# Patient Record
Sex: Male | Born: 1995 | Race: White | Hispanic: No | Marital: Single | Smoking: Never smoker
Health system: Southern US, Community
[De-identification: ages and names within clinical notes are randomized; demographics above are authoritative.]

## PROBLEM LIST (undated history)

## (undated) ENCOUNTER — Emergency Department (HOSPITAL_COMMUNITY): Discharge status: Against Medical Advice | Payer: Medicaid Other

## (undated) DIAGNOSIS — R55 Syncope and collapse: Secondary | ICD-10-CM

## (undated) DIAGNOSIS — F909 Attention-deficit hyperactivity disorder, unspecified type: Secondary | ICD-10-CM

## (undated) DIAGNOSIS — G8929 Other chronic pain: Secondary | ICD-10-CM

## (undated) DIAGNOSIS — I4581 Long QT syndrome: Secondary | ICD-10-CM

## (undated) DIAGNOSIS — R29898 Other symptoms and signs involving the musculoskeletal system: Secondary | ICD-10-CM

## (undated) DIAGNOSIS — R079 Chest pain, unspecified: Secondary | ICD-10-CM

## (undated) HISTORY — DX: Syncope and collapse: R55

---
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
## 2017-04-30 ENCOUNTER — Encounter: Payer: Self-pay | Admitting: Family

## 2017-04-30 ENCOUNTER — Ambulatory Visit (INDEPENDENT_AMBULATORY_CARE_PROVIDER_SITE_OTHER): Payer: Medicaid Other | Admitting: Family

## 2017-04-30 VITALS — BP 114/77 | HR 63 | Temp 97.3°F | Ht 64.0 in | Wt 148.2 lb

## 2017-04-30 DIAGNOSIS — M7522 Bicipital tendinitis, left shoulder: Secondary | ICD-10-CM | POA: Diagnosis not present

## 2017-04-30 MED ORDER — NAPROXEN 500 MG PO TABS: 500.0000 mg | ORAL_TABLET | Freq: Two times a day (BID) | ORAL | 1 refills | Status: DC

## 2017-04-30 NOTE — Patient Instructions (Signed)
Biceps Tendon Tendinitis (Distal) Distal biceps tendon tendinitis is inflammation of the distal biceps tendon. The distal biceps tendon is a strong cord of tissue that connects the biceps muscle, on the front of the upper arm, to a bone (radius) in the elbow. Distal biceps tendon tendinitis can interfere with the ability to bend the elbow and turn the hand palm-up (supination). This condition is usually caused by overusing the elbow joint and the biceps muscle, and it usually heals within 6 weeks. Distal biceps tendon tendinitis may include a grade 1 or grade 2 strain of the tendon. A grade 1 strain is mild, and it involves a slight pull of the tendon without any stretching or noticeable tearing of the tendon. There is usually no loss of biceps muscle strength. A grade 2 strain is moderate, and it involves a small tear in the tendon. The tendon is stretched, and biceps muscle strength is usually decreased. What are the causes? This condition may be caused by:  A sudden increase in the frequency or intensity of activity that involves the elbow and the biceps muscle.  Overuse of the biceps muscle. This can happen when you do the same movements over and over, such as: ? Supination. ? Forceful straightening (hyperextension) of the elbow. ? Bending of the elbow.  A direct, forceful hit or injury (trauma) to the elbow. This is rare.  What increases the risk? The following factors may make you more likely to develop this condition:  Playing contact sports.  Playing sports that involve throwing and overhead movements, including racket sports, gymnastics, weight lifting, or bodybuilding.  Doing physical labor.  Having poor strength and flexibility of the arm and shoulder.  Having injured other parts of the elbow.  What are the signs or symptoms? Symptoms of this condition may include:  Pain and inflammation in the front of the elbow. Pain may get worse during certain movements, such  as: ? Supination. ? Bending the elbow. ? Lifting or carrying objects. ? Throwing.  A feeling of warmth in the front of the elbow.  A crackling sound (crepitation) when you move or touch the elbow or the upper arm.  In some cases, symptoms may return (recur) after treatment, and they may be long-lasting (chronic). How is this diagnosed? This condition is diagnosed based on your symptoms, your medical history, and a physical exam. You may have tests, including X-rays or MRIs. Your health care provider may test your range of motion by having you do arm movements. How is this treated? This condition is treated by resting and icing the injured area, and by doing physical therapy exercises. Depending on the severity of your condition, treatment may also include:  Medicines to help relieve pain and inflammation.  Ultrasound therapy. This is the application of sound waves to the injured area.  Follow these instructions at home: Managing pain, stiffness, and swelling  If directed, put ice on the injured area: ? Put ice in a plastic bag. ? Place a towel between your skin and the bag. ? Leave the ice on for 20 minutes, 2-3 times a day.  Move your fingers often to avoid stiffness and to lessen swelling.  Raise (elevate) the injured area above the level of your heart while you are sitting or lying down.  If directed, apply heat to the affected area before you exercise. Use the heat source that your health care provider recommends, such as a moist heat pack or a heating pad. ? Place a towel between   your skin and the heat source. ? Leave the heat on for 20-30 minutes. ? Remove the heat if your skin turns bright red. This is especially important if you are unable to feel pain, heat, or cold. You may have a greater risk of getting burned. Activity  Return to your normal activities as told by your health care provider. Ask your health care provider what activities are safe for you.  Do not lift  anything that is heavier than 10 lb (4.5 kg) until your health care provider tells you that it is safe.  Avoid activities that cause pain or make your condition worse.  Do exercises as told by your health care provider. General instructions  Take over-the-counter and prescription medicines only as told by your health care provider.  Do not drive or operate heavy machinery while taking prescription pain medicines.  Keep all follow-up visits as told by your health care provider. This is important. How is this prevented?  Warm up and stretch before being active.  Cool down and stretch after being active.  Give your body time to rest between periods of activity.  Make sure to use equipment that fits you.  Be safe and responsible while being active to avoid falls.  Do at least 150 minutes of moderate-intensity exercise each week, such as brisk walking or water aerobics.  Maintain physical fitness, including: ? Strength. ? Flexibility. ? Cardiovascular fitness. ? Endurance. Contact a health care provider if:  You have symptoms that get worse or do not get better after 2 weeks of treatment.  You develop new symptoms. Get help right away if:  You develop severe pain. This information is not intended to replace advice given to you by your health care provider. Make sure you discuss any questions you have with your health care provider. Document Released: 10/26/2005 Document Revised: 07/02/2016 Document Reviewed: 10/04/2015 Elsevier Interactive Patient Education  2018 [employer]  

## 2017-04-30 NOTE — Progress Notes (Signed)
Subjective:    Patient ID: Willie Foley [facility], male    DOB: [birth year], 21 y.o.   MRN: [id number]  Pt presents to the office today with left shoulder and bicep pain. He states he went to the gym for the first time three days ago.  Shoulder Pain   The pain is present in the left shoulder. This is a new problem. The current episode started yesterday. There has been no history of extremity trauma. The problem occurs intermittently. The problem has been waxing and waning. The quality of the pain is described as aching. The pain is at a severity of 7/10. The pain is mild. Pertinent negatives include no inability to bear weight, limited range of motion, numbness, stiffness or tingling. The symptoms are aggravated by activity. He has tried nothing for the symptoms. The treatment provided no relief.      Review of Systems  Musculoskeletal: Positive for myalgias. Negative for stiffness.  Neurological: Negative for tingling and numbness.  All other systems reviewed and are negative.      Objective:   Physical Exam  Constitutional: He is oriented to person, place, and time. He appears well-developed and well-nourished. No distress.  HENT:  Head: Normocephalic.  Eyes: Pupils are equal, round, and reactive to light. Right eye exhibits no discharge. Left eye exhibits no discharge.  Cardiovascular: Normal rate, regular rhythm, normal heart sounds and intact distal pulses.   No murmur heard. Pulmonary/Chest: Effort normal and breath sounds normal. No respiratory distress. He has no wheezes.  Abdominal: Soft. Bowel sounds are normal. He exhibits no distension. There is no tenderness.  Musculoskeletal: Normal range of motion. He exhibits tenderness. He exhibits no edema.  Right bicep pain with abduction   Neurological: He is alert and oriented to person, place, and time.  Skin: Skin is warm and dry. No rash noted. No erythema.  Psychiatric: He has a normal mood and affect. His behavior is normal.  Judgment and thought content normal.  Vitals reviewed.     BP 114/77   Pulse 63   Temp 97.3 F (36.3 C) (Oral)   Ht 5\' 4"  (1.626 m)   Wt 148 lb 3.2 oz (67.2 kg)   BMI 25.44 kg/m      Assessment & Plan:  1. Biceps tendinitis of left upper extremity Rest Ice  Avoid weight lifting for next 1-2 weeks RTO prn  - naproxen (NAPROSYN) 500 MG tablet; Take 1 tablet (500 mg total) by mouth 2 (two) times daily with a meal.  Dispense: 60 tablet; Refill: 1   Jannifer Rodney, FNP

## 2017-12-11 DIAGNOSIS — R112 Nausea with vomiting, unspecified: Secondary | ICD-10-CM | POA: Diagnosis not present

## 2018-08-11 DIAGNOSIS — J028 Acute pharyngitis due to other specified organisms: Secondary | ICD-10-CM | POA: Diagnosis not present

## 2018-08-11 DIAGNOSIS — R509 Fever, unspecified: Secondary | ICD-10-CM | POA: Diagnosis not present

## 2018-08-11 DIAGNOSIS — J029 Acute pharyngitis, unspecified: Secondary | ICD-10-CM | POA: Diagnosis not present

## 2018-08-12 DIAGNOSIS — Z8709 Personal history of other diseases of the respiratory system: Secondary | ICD-10-CM | POA: Diagnosis not present

## 2018-08-12 DIAGNOSIS — J029 Acute pharyngitis, unspecified: Secondary | ICD-10-CM | POA: Diagnosis not present

## 2019-02-17 DIAGNOSIS — K0889 Other specified disorders of teeth and supporting structures: Secondary | ICD-10-CM | POA: Diagnosis not present

## 2019-02-20 DIAGNOSIS — Z79899 Other long term (current) drug therapy: Secondary | ICD-10-CM | POA: Diagnosis not present

## 2019-02-20 DIAGNOSIS — K0889 Other specified disorders of teeth and supporting structures: Secondary | ICD-10-CM | POA: Diagnosis not present

## 2019-09-05 ENCOUNTER — Other Ambulatory Visit: Payer: Self-pay

## 2019-09-05 DIAGNOSIS — Z20822 Contact with and (suspected) exposure to covid-19: Secondary | ICD-10-CM

## 2019-09-05 DIAGNOSIS — Z20828 Contact with and (suspected) exposure to other viral communicable diseases: Secondary | ICD-10-CM | POA: Diagnosis not present

## 2019-09-07 LAB — NOVEL CORONAVIRUS, NAA: SARS-CoV-2, NAA: NOT DETECTED

## 2019-10-16 ENCOUNTER — Other Ambulatory Visit: Payer: Self-pay

## 2019-10-16 ENCOUNTER — Encounter: Payer: Self-pay | Admitting: Family Medicine

## 2019-10-16 ENCOUNTER — Ambulatory Visit (INDEPENDENT_AMBULATORY_CARE_PROVIDER_SITE_OTHER): Payer: Medicaid Other | Admitting: Family Medicine

## 2019-10-16 DIAGNOSIS — J069 Acute upper respiratory infection, unspecified: Secondary | ICD-10-CM | POA: Diagnosis not present

## 2019-10-16 NOTE — Progress Notes (Signed)
   Virtual Visit via telephone Note  I connected with Willie Foley on 10/16/19 at 1341 by telephone and verified that I am speaking with the correct person using two identifiers. Willie Foley is currently located at home and mother are currently with her during visit. The provider, Fransisca Kaufmann Krystina Strieter, MD is located in their office at time of visit.  Call ended at 1349  I discussed the limitations, risks, security and privacy concerns of performing an evaluation and management service by telephone and the availability of in person appointments. I also discussed with the patient that there may be a patient responsible charge related to this service. The patient expressed understanding and agreed to proceed.   History and Present Illness: Patient is calling in for cough and sore throat that has been going on for 4 days.  He is getting headaches as well and he is coughing phlegm and having body aches.  The body aches started yesterday and denies any fevers.  He denies any sick contacts that he knows of. He works at a [employer].   No diagnosis found.  Outpatient Encounter Medications as of 10/16/2019  Medication Sig  . naproxen (NAPROSYN) 500 MG tablet Take 1 tablet (500 mg total) by mouth 2 (two) times daily with a meal.   No facility-administered encounter medications on file as of 10/16/2019.     Review of Systems  Constitutional: Positive for chills. Negative for fever.  HENT: Positive for congestion, postnasal drip, rhinorrhea, sinus pressure, sneezing and sore throat. Negative for ear discharge, ear pain and voice change.   Eyes: Negative for pain, discharge, redness and visual disturbance.  Respiratory: Positive for cough. Negative for shortness of breath and wheezing.   Cardiovascular: Negative for chest pain and leg swelling.  Musculoskeletal: Positive for myalgias. Negative for gait problem.  Skin: Negative for rash.  Neurological: Positive for headaches.  All other systems  reviewed and are negative.   Observations/Objective: Patient sounds comfortable  Assessment and Plan: Problem List Items Addressed This Visit    None    Visit Diagnoses    Viral upper respiratory infection    -  Primary   Relevant Orders   Novel Coronavirus, NAA ([facility])       Follow Up Instructions:  Recommended mucinex and flonase and recommend covid testing. Recommended quarantine.    I discussed the assessment and treatment plan with the patient. The patient was provided an opportunity to ask questions and all were answered. The patient agreed with the plan and demonstrated an understanding of the instructions.   The patient was advised to call back or seek an in-person evaluation if the symptoms worsen or if the condition fails to improve as anticipated.  The above assessment and management plan was discussed with the patient. The patient verbalized understanding of and has agreed to the management plan. Patient is aware to call the clinic if symptoms persist or worsen. Patient is aware when to return to the clinic for a follow-up visit. Patient educated on when it is appropriate to go to the emergency department.    I provided 8 minutes of non-face-to-face time during this encounter.    Worthy Rancher, MD

## 2019-10-17 ENCOUNTER — Other Ambulatory Visit: Payer: Self-pay

## 2019-10-17 DIAGNOSIS — Z20822 Contact with and (suspected) exposure to covid-19: Secondary | ICD-10-CM

## 2019-10-17 DIAGNOSIS — Z20828 Contact with and (suspected) exposure to other viral communicable diseases: Secondary | ICD-10-CM | POA: Diagnosis not present

## 2019-10-19 LAB — NOVEL CORONAVIRUS, NAA: SARS-CoV-2, NAA: DETECTED — AB

## 2019-10-20 ENCOUNTER — Telehealth: Payer: Self-pay

## 2019-10-20 NOTE — Telephone Encounter (Signed)
Pt notified of positive COVID-19 test results. Pt verbalized understanding. Pt reports that he has loss of taste and diarrhea.Pt advised to remain in self quarantine until at least 10 days since symptom onset And 3 consecutive days fever free without antipyretics And improvement in respiratory symptoms. Patient advised to utilize over the counter medications to treat symptoms. Pt advised to seek treatment in the ED if respiratory issues/distress develops.Pt advised they should only leave home to seek and medical care and must wear a mask in public. Pt instructed to limit contact with family members or caregivers in the home. Pt advised to practice social distancing and to continue to use good preventative care measures such has frequent hand washing, staying out of crowds and cleaning hard surfaces frequently touched in the home.Pt informed that the health department will likely follow up and may have additional recommendations. Will notify [facility] Department.

## 2019-10-30 DIAGNOSIS — R0781 Pleurodynia: Secondary | ICD-10-CM | POA: Diagnosis not present

## 2019-10-30 DIAGNOSIS — R109 Unspecified abdominal pain: Secondary | ICD-10-CM | POA: Diagnosis not present

## 2019-10-30 DIAGNOSIS — M549 Dorsalgia, unspecified: Secondary | ICD-10-CM | POA: Diagnosis not present

## 2019-12-25 DIAGNOSIS — K0889 Other specified disorders of teeth and supporting structures: Secondary | ICD-10-CM | POA: Diagnosis not present

## 2019-12-25 DIAGNOSIS — R6884 Jaw pain: Secondary | ICD-10-CM | POA: Diagnosis not present

## 2019-12-27 DIAGNOSIS — R1111 Vomiting without nausea: Secondary | ICD-10-CM | POA: Diagnosis not present

## 2019-12-27 DIAGNOSIS — R109 Unspecified abdominal pain: Secondary | ICD-10-CM | POA: Diagnosis not present

## 2019-12-27 DIAGNOSIS — R112 Nausea with vomiting, unspecified: Secondary | ICD-10-CM | POA: Diagnosis not present

## 2019-12-28 ENCOUNTER — Emergency Department (HOSPITAL_COMMUNITY)
Admission: EM | Admit: 2019-12-28 | Discharge: 2019-12-28 | Disposition: A | Discharge status: Home / Self Care | Payer: Medicaid Other | Attending: Emergency Medicine | Admitting: Emergency Medicine

## 2019-12-28 ENCOUNTER — Encounter (HOSPITAL_COMMUNITY): Payer: Self-pay | Admitting: *Deleted

## 2019-12-28 ENCOUNTER — Other Ambulatory Visit: Payer: Self-pay

## 2019-12-28 DIAGNOSIS — Z03818 Encounter for observation for suspected exposure to other biological agents ruled out: Secondary | ICD-10-CM | POA: Diagnosis not present

## 2019-12-28 DIAGNOSIS — M5489 Other dorsalgia: Secondary | ICD-10-CM | POA: Diagnosis not present

## 2019-12-28 DIAGNOSIS — R748 Abnormal levels of other serum enzymes: Secondary | ICD-10-CM | POA: Diagnosis not present

## 2019-12-28 DIAGNOSIS — R110 Nausea: Secondary | ICD-10-CM | POA: Diagnosis not present

## 2019-12-28 DIAGNOSIS — K0889 Other specified disorders of teeth and supporting structures: Secondary | ICD-10-CM | POA: Diagnosis present

## 2019-12-28 DIAGNOSIS — R1111 Vomiting without nausea: Secondary | ICD-10-CM | POA: Diagnosis not present

## 2019-12-28 DIAGNOSIS — R112 Nausea with vomiting, unspecified: Secondary | ICD-10-CM | POA: Diagnosis not present

## 2019-12-28 DIAGNOSIS — R52 Pain, unspecified: Secondary | ICD-10-CM | POA: Diagnosis not present

## 2019-12-28 DIAGNOSIS — M545 Low back pain: Secondary | ICD-10-CM | POA: Diagnosis not present

## 2019-12-28 DIAGNOSIS — R109 Unspecified abdominal pain: Secondary | ICD-10-CM | POA: Diagnosis not present

## 2019-12-28 DIAGNOSIS — N179 Acute kidney failure, unspecified: Secondary | ICD-10-CM | POA: Insufficient documentation

## 2019-12-28 DIAGNOSIS — Z20822 Contact with and (suspected) exposure to covid-19: Secondary | ICD-10-CM | POA: Insufficient documentation

## 2019-12-28 LAB — COMPREHENSIVE METABOLIC PANEL
ALT: 13 U/L (ref 0–44)
AST: 23 U/L (ref 15–41)
Albumin: 4.7 g/dL (ref 3.5–5.0)
Alkaline Phosphatase: 91 U/L (ref 38–126)
Anion gap: 12 (ref 5–15)
BUN: 19 mg/dL (ref 6–20)
CO2: 26 mmol/L (ref 22–32)
Calcium: 9.4 mg/dL (ref 8.9–10.3)
Chloride: 102 mmol/L (ref 98–111)
Creatinine, Ser: 2.34 mg/dL — ABNORMAL HIGH (ref 0.61–1.24)
GFR calc Af Amer: 44 mL/min — ABNORMAL LOW (ref >60)
GFR calc non Af Amer: 38 mL/min — ABNORMAL LOW (ref >60)
Glucose, Bld: 105 mg/dL — ABNORMAL HIGH (ref 70–99)
Potassium: 3.4 mmol/L — ABNORMAL LOW (ref 3.5–5.1)
Sodium: 140 mmol/L (ref 135–145)
Total Bilirubin: 2.1 mg/dL — ABNORMAL HIGH (ref 0.3–1.2)
Total Protein: 8 g/dL (ref 6.5–8.1)

## 2019-12-28 LAB — CBC WITH DIFFERENTIAL/PLATELET
Abs Immature Granulocytes: 0.02 10*3/uL (ref 0.00–0.07)
Basophils Absolute: 0 10*3/uL (ref 0.0–0.1)
Basophils Relative: 0 %
Eosinophils Absolute: 0 10*3/uL (ref 0.0–0.5)
Eosinophils Relative: 0 %
HCT: 40.7 % (ref 39.0–52.0)
Hemoglobin: 13.6 g/dL (ref 13.0–17.0)
Immature Granulocytes: 0 %
Lymphocytes Relative: 8 %
Lymphs Abs: 0.6 10*3/uL — ABNORMAL LOW (ref 0.7–4.0)
MCH: 28.9 pg (ref 26.0–34.0)
MCHC: 33.4 g/dL (ref 30.0–36.0)
MCV: 86.4 fL (ref 80.0–100.0)
Monocytes Absolute: 0.7 10*3/uL (ref 0.1–1.0)
Monocytes Relative: 9 %
Neutro Abs: 6.3 10*3/uL (ref 1.7–7.7)
Neutrophils Relative %: 83 %
Platelets: 253 10*3/uL (ref 150–400)
RBC: 4.71 MIL/uL (ref 4.22–5.81)
RDW: 11.9 % (ref 11.5–15.5)
WBC: 7.7 10*3/uL (ref 4.0–10.5)
nRBC: 0 % (ref 0.0–0.2)

## 2019-12-28 LAB — CK: Total CK: 161 U/L (ref 49–397)

## 2019-12-28 LAB — URINALYSIS, ROUTINE W REFLEX MICROSCOPIC
Bacteria, UA: NONE SEEN
Bilirubin Urine: NEGATIVE
Glucose, UA: NEGATIVE mg/dL
Hgb urine dipstick: NEGATIVE
Ketones, ur: NEGATIVE mg/dL
Leukocytes,Ua: NEGATIVE
Nitrite: NEGATIVE
Protein, ur: 100 mg/dL — AB
Specific Gravity, Urine: 1.009 (ref 1.005–1.030)
pH: 6 (ref 5.0–8.0)

## 2019-12-28 LAB — LIPASE, BLOOD: Lipase: 17 U/L (ref 11–51)

## 2019-12-28 LAB — RAPID URINE DRUG SCREEN, HOSP PERFORMED
Amphetamines: NOT DETECTED
Barbiturates: NOT DETECTED
Benzodiazepines: NOT DETECTED
Cocaine: NOT DETECTED
Opiates: NOT DETECTED
Tetrahydrocannabinol: POSITIVE — AB

## 2019-12-28 LAB — SARS CORONAVIRUS 2 (TAT 6-24 HRS): SARS Coronavirus 2: NEGATIVE

## 2019-12-28 MED ORDER — SODIUM CHLORIDE 0.9 % IV BOLUS (SEPSIS)
1000.0000 mL | Freq: Once | INTRAVENOUS | Status: AC
Start: 2019-12-28 — End: 2019-12-28
  Administered 2019-12-28: 13:00:00 1000 mL via INTRAVENOUS

## 2019-12-28 MED ORDER — SODIUM CHLORIDE 0.9 % IV SOLN: 1000.0000 mL | INTRAVENOUS | Status: DC

## 2019-12-28 MED ORDER — KETOROLAC TROMETHAMINE 30 MG/ML IJ SOLN
30.0000 mg | Freq: Once | INTRAMUSCULAR | Status: AC
  Administered 2019-12-28: 30 mg via INTRAVENOUS
  Filled 2019-12-28: qty 1

## 2019-12-28 MED ORDER — ONDANSETRON HCL 4 MG/2ML IJ SOLN
4.0000 mg | Freq: Once | INTRAMUSCULAR | Status: AC
  Administered 2019-12-28: 4 mg via INTRAVENOUS
  Filled 2019-12-28: qty 2

## 2019-12-28 MED ORDER — ONDANSETRON 8 MG PO TBDP: 8.0000 mg | ORAL_TABLET | Freq: Three times a day (TID) | ORAL | 0 refills | Status: DC | PRN

## 2019-12-28 MED ORDER — HYDROCODONE-ACETAMINOPHEN 5-325 MG PO TABS: 1.0000 | ORAL_TABLET | Freq: Four times a day (QID) | ORAL | 0 refills | Status: DC | PRN

## 2019-12-28 NOTE — ED Provider Notes (Addendum)
[facility] EMERGENCY DEPARTMENT Provider Note   CSN: [id number] Arrival date & time: 12/28/19  1255     History Chief Complaint  Patient presents with  . Back Pain    Willie Foley is a 24 y.o. male.  HPI   Patient started having difficulty with dental pain earlier this month.  He was seen in urgent care on February 15.  He was started on amoxicillin.  Patient states a couple days ago he started having trouble with nausea vomiting and back pain.  He denies abdominal pain or diarrhea.  He has had several episodes of nausea and vomiting.  He states he went to [facility] facility.  He indicates he had laboratory tests and some type of x-rays which may have been a CAT scan.  Patient states he was there several hours.  No one told him specifically what was wrong.  He was told his tests were fine.  Patient was instructed to take ibuprofen.  Patient states he was called back and was told that he needed to get a Covid test.  He came to the ED today because he continues to have nausea vomiting and back pain.  He denies any fevers.  No dysuria.  He denies any IV drug use.  He denies any alcohol.  He denies any significant medical problems.  Past Medical History:  Diagnosis Date  . ADHD (attention deficit hyperactivity disorder)   . Chronic chest pain   . Long Q-T syndrome    workup by [facility] Dr. Raul Del NEGATIVE for this  . Near syncope    recurrent  . Vasovagal near syncope    worked up at [facility]  . Weakness of both legs    recurrent, intermittent    Patient Active Problem List   Diagnosis Date Noted  . Leg weakness, bilateral 10/11/2013  . Syncopal episodes 10/11/2013  . Vasovagal near syncope   . ADHD (attention deficit hyperactivity disorder) 07/27/2013  . ODD (oppositional defiant disorder) 07/27/2013  . Migraine, unspecified, without mention of intractable migraine without mention of status migrainosus 07/27/2013  . Enuresis 07/27/2013  . Allergic rhinitis  07/27/2013  . Herpetic gingivostomatitis 07/27/2013  . Sexually active at young age 40/18/2014  . Long Q-T syndrome 04/05/2013    History reviewed. No pertinent surgical history.     History reviewed. No pertinent family history.  Social History   Tobacco Use  . Smoking status: Never Smoker  . Smokeless tobacco: Never Used  Substance Use Topics  . Alcohol use: No  . Drug use: Yes    Frequency: 3.0 times per week    Types: Marijuana    Home Medications Prior to Admission medications   Medication Sig Start Date End Date Taking? Authorizing Provider  ibuprofen (ADVIL) 200 MG tablet Take 400 mg by mouth every 6 (six) hours as needed for fever or moderate pain.   Yes [provider]  ibuprofen (ADVIL) 800 MG tablet Take 800 mg by mouth every 8 (eight) hours as needed.   Yes [provider]  amoxicillin (AMOXIL) 875 MG tablet Take 875 mg by mouth 2 (two) times daily.     [provider]  HYDROcodone-acetaminophen (NORCO/VICODIN) 5-325 MG tablet Take 1 tablet by mouth every 6 (six) hours as needed. 12/28/19   Dorie Rank, MD  ondansetron (ZOFRAN ODT) 8 MG disintegrating tablet Take 1 tablet (8 mg total) by mouth every 8 (eight) hours as needed for nausea or vomiting. 12/28/19   Dorie Rank,  MD    Allergies    Patient has no known allergies.  Review of Systems   Review of Systems  All other systems reviewed and are negative.   Physical Exam Updated Vital Signs BP (!) 152/89 (BP Location: Right Arm)   Pulse 80   Temp 98.3 F (36.8 C)   Resp 14   Ht 1.651 m (5\' 5" )   Wt 63.5 kg   SpO2 100%   BMI 23.30 kg/m   Physical Exam Vitals and nursing note reviewed.  Constitutional:      General: He is not in acute distress.    Appearance: He is well-developed.  HENT:     Head: Normocephalic and atraumatic.     Right Ear: External ear normal.     Left Ear: External ear normal.  Eyes:     General: No scleral icterus.       Right eye: No discharge.         Left eye: No discharge.     Conjunctiva/sclera: Conjunctivae normal.  Neck:     Trachea: No tracheal deviation.  Cardiovascular:     Rate and Rhythm: Normal rate and regular rhythm.  Pulmonary:     Effort: Pulmonary effort is normal. No respiratory distress.     Breath sounds: Normal breath sounds. No stridor. No wheezing or rales.  Abdominal:     General: Bowel sounds are normal. There is no distension.     Palpations: Abdomen is soft.     Tenderness: There is no abdominal tenderness. There is no guarding or rebound.  Musculoskeletal:        General: No tenderness.     Cervical back: Neck supple.     Comments: TTP proximal lumber region, no erythema, no fluctuance  Skin:    General: Skin is warm and dry.     Findings: No rash.  Neurological:     Mental Status: He is alert.     Cranial Nerves: No cranial nerve deficit (no facial droop, extraocular movements intact, no slurred speech).     Sensory: No sensory deficit.     Motor: No abnormal muscle tone or seizure activity.     Coordination: Coordination normal.     ED Results / Procedures / Treatments   Labs (all labs ordered are listed, but only abnormal results are displayed) Labs Reviewed  CBC WITH DIFFERENTIAL/PLATELET - Abnormal; Notable for the following components:      Result Value   Lymphs Abs 0.6 (*)    All other components within normal limits  COMPREHENSIVE METABOLIC PANEL - Abnormal; Notable for the following components:   Potassium 3.4 (*)    Glucose, Bld 105 (*)    Creatinine, Ser 2.34 (*)    Total Bilirubin 2.1 (*)    GFR calc non Af Amer 38 (*)    GFR calc Af Amer 44 (*)    All other components within normal limits  URINALYSIS, ROUTINE W REFLEX MICROSCOPIC - Abnormal; Notable for the following components:   Protein, ur 100 (*)    All other components within normal limits  RAPID URINE DRUG SCREEN, HOSP PERFORMED - Abnormal; Notable for the following components:   Tetrahydrocannabinol POSITIVE (*)      All other components within normal limits  SARS CORONAVIRUS 2 (TAT 6-24 HRS)  LIPASE, BLOOD  CK    EKG None  Radiology No results found.  Procedures Procedures (including critical care time)  Medications Ordered in ED Medications  sodium chloride 0.9 % bolus  1,000 mL (0 mLs Intravenous Stopped 12/28/19 1523)    Followed by  0.9 %  sodium chloride infusion (has no administration in time range)  ketorolac (TORADOL) 30 MG/ML injection 30 mg (30 mg Intravenous Given 12/28/19 1337)  ondansetron (ZOFRAN) injection 4 mg (4 mg Intravenous Given 12/28/19 1337)    ED Course  I have reviewed the triage vital signs and the nursing notes.  Pertinent labs & imaging results that were available during my care of the patient were reviewed by me and considered in my medical decision making (see chart for details).  Clinical Course as of Dec 27 1540  Thu Dec 28, 2019  1322 CT scan findings from February 17 at more had available within the PACS EMR.  Musculoskeletal: There are no blastic or lytic bone lesions. There is no intramuscular or abdominal wall lesion.  IMPRESSION: 1. A cause for patient's symptoms has not been established with this study.  2. No evident renal or ureteral calculus. No hydronephrosis on either side. Urinary bladder wall thickness normal.  3. No bowel obstruction. No abscess in the abdomen or pelvis. Appendix appears normal.   Electronically Signed By: Bretta Bang III M.D. On: 12/27/2019 10:36   [JK]  1440 Labs reviewed.  Patient's creatinine is elevated 2.34.   [JK]  1440 Significantly increased from 5 years ago.  Considering his back ache muscle ache I will add on a CK to assess for rhabdo   [JK]  1541 NOte:  Unable to e prescribed.  System not working.  PDMP reviewed   [JK]    Clinical Course User Index [JK] Linwood Dibbles, MD   MDM Rules/Calculators/A&P                      Pt presents with back pain, nausea and vomiting.  Recent visit at another  ED.  CT Negative for acute process.  Increased cr today.  Toradol given earlier.  Will hold off on additional nsaids. Will check for rhabdo.  UA pending.  Will add on uds.  Care turned over to oncoming md  Final Clinical Impression(s) / ED Diagnoses Final diagnoses:  Elevated creatine kinase  AKI (acute kidney injury) (HCC)      Linwood Dibbles, MD 12/28/19 1443 Patient CK is normal.  Vital signs are normal.  He has been tolerating fluids.  No further vomiting.  Patient appears stable for discharge.  Will discharge home with prescription for hydrocodone and Zofran.  Stressed importance of having creatinine rechecked in 24 to 48 hours   Linwood Dibbles, MD 12/28/19 1542

## 2019-12-28 NOTE — Discharge Instructions (Addendum)
Take the medications as needed for nausea.  You can take the hydrocodone for pain.  Avoid ibuprofen, aspirin etc for the next week.  Tylenol is ok to take for mild pain if you do not need the hydrocodone.  Make sure to drink plenty of fluids, follow up with a doctor or the ED in 2 days to have your kidney function rechecked.

## 2019-12-28 NOTE — ED Triage Notes (Addendum)
Patient arrived by EMS for upper and lower back pain for 2 days.  Patient ambulates, and denies urinary symptoms.  Patient has tried Ibuprofren with no relief.  Patient seen at [facility] yesterday for same thing.  Patient stated they "didn't find anything".

## 2019-12-29 ENCOUNTER — Encounter: Payer: Self-pay | Admitting: Family Medicine

## 2019-12-29 ENCOUNTER — Ambulatory Visit (INDEPENDENT_AMBULATORY_CARE_PROVIDER_SITE_OTHER): Payer: Medicaid Other | Admitting: Family Medicine

## 2019-12-29 ENCOUNTER — Telehealth: Payer: Self-pay | Admitting: Family Medicine

## 2019-12-29 DIAGNOSIS — R112 Nausea with vomiting, unspecified: Secondary | ICD-10-CM | POA: Diagnosis not present

## 2019-12-29 DIAGNOSIS — Z09 Encounter for follow-up examination after completed treatment for conditions other than malignant neoplasm: Secondary | ICD-10-CM | POA: Diagnosis not present

## 2019-12-29 DIAGNOSIS — R52 Pain, unspecified: Secondary | ICD-10-CM | POA: Diagnosis not present

## 2019-12-29 DIAGNOSIS — R5381 Other malaise: Secondary | ICD-10-CM | POA: Diagnosis not present

## 2019-12-29 DIAGNOSIS — N179 Acute kidney failure, unspecified: Secondary | ICD-10-CM

## 2019-12-29 DIAGNOSIS — R110 Nausea: Secondary | ICD-10-CM | POA: Diagnosis not present

## 2019-12-29 MED ORDER — PROMETHAZINE HCL 12.5 MG PO TABS: 12.5000 mg | ORAL_TABLET | Freq: Three times a day (TID) | ORAL | 0 refills | Status: DC | PRN

## 2019-12-29 NOTE — Telephone Encounter (Signed)
Appt scheduled. Patient aware. 

## 2019-12-29 NOTE — Progress Notes (Signed)
Virtual Visit via telephone Note Due to COVID-19 pandemic this visit was conducted virtually. This visit type was conducted due to national recommendations for restrictions regarding the COVID-19 Pandemic (e.g. social distancing, sheltering in place) in an effort to limit this patient's exposure and mitigate transmission in our community. All issues noted in this document were discussed and addressed.  A physical exam was not performed with this format.   I connected with Willie Foley on 12/29/2019 at 1345 by telephone and verified that I am speaking with the correct person using two identifiers. Willie Foley is currently located at home and family is currently with them during visit. The provider, Monia Pouch, FNP is located in their office at time of visit.  I discussed the limitations, risks, security and privacy concerns of performing an evaluation and management service by telephone and the availability of in person appointments. I also discussed with the patient that there may be a patient responsible charge related to this service. The patient expressed understanding and agreed to proceed.  Subjective:  Patient ID: Willie Foley [facility], male    DOB: Apr 04, 1996, 24 y.o.   MRN: [id number]  Chief Complaint:  Vomiting   HPI: Willie Foley is a 24 y.o. male presenting on 12/29/2019 for Vomiting   Pt following up today after discharge form the ED yesterday. Pt reports he has been vomiting for several days. States he has been to the ED several times for the same complaint. His COVID-19 test was negative. Labs in the ED yesterday revealed AKI with a creatinine of 2.34. He was given IV fluids while in the ED. States he was discharged home with zofran and hydrocodone. He has been taking the medications without complete resolution of symptoms. States he has been trying to keep liquids down but is unable to hold solids down. No fever, chills, or confusion. States his urine remains darker than normal. No  chest pain, SHOB, or palpitations. No dizziness or syncope. Pt states he does have intermittent abdominal pain and back pain.  Emesis  This is a recurrent problem. The current episode started 1 to 4 weeks ago. The problem occurs 2 to 4 times per day. The problem has been waxing and waning. The emesis has an appearance of bile. There has been no fever. Associated symptoms include abdominal pain and myalgias. Pertinent negatives include no arthralgias, chest pain, chills, coughing, diarrhea, dizziness, fever, headaches, sweats, URI or weight loss. He has tried increased fluids (zofran) for the symptoms. The treatment provided no relief.     Relevant past medical, surgical, family, and social history reviewed and updated as indicated.  Allergies and medications reviewed and updated.   Past Medical History:  Diagnosis Date  . ADHD (attention deficit hyperactivity disorder)   . Chronic chest pain   . Long Q-T syndrome    workup by [facility] Dr. Raul Del NEGATIVE for this  . Near syncope    recurrent  . Vasovagal near syncope    worked up at [facility]  . Weakness of both legs    recurrent, intermittent    History reviewed. No pertinent surgical history.  Social History   Socioeconomic History  . Marital status: Single    Spouse name: Not on file  . Number of children: Not on file  . Years of education: Not on file  . Highest education level: Not on file  Occupational History  . Not on file  Tobacco Use  . Smoking status: Never Smoker  .  Smokeless tobacco: Never Used  Substance and Sexual Activity  . Alcohol use: No  . Drug use: Yes    Frequency: 3.0 times per week    Types: Marijuana  . Sexual activity: Not on file  Other Topics Concern  . Not on file  Social History Narrative  . Not on file   Social Determinants of Health   Financial Resource Strain:   . Difficulty of Paying Living Expenses: Not on file  Food Insecurity:   . Worried About Charity fundraiser  in the Last Year: Not on file  . Ran Out of Food in the Last Year: Not on file  Transportation Needs:   . Lack of Transportation (Medical): Not on file  . Lack of Transportation (Non-Medical): Not on file  Physical Activity:   . Days of Exercise per Week: Not on file  . Minutes of Exercise per Session: Not on file  Stress:   . Feeling of Stress : Not on file  Social Connections:   . Frequency of Communication with Friends and Family: Not on file  . Frequency of Social Gatherings with Friends and Family: Not on file  . Attends Religious Services: Not on file  . Active Member of Clubs or Organizations: Not on file  . Attends Archivist Meetings: Not on file  . Marital Status: Not on file  Intimate Partner Violence:   . Fear of Current or Ex-Partner: Not on file  . Emotionally Abused: Not on file  . Physically Abused: Not on file  . Sexually Abused: Not on file    Outpatient Encounter Medications as of 12/29/2019  Medication Sig  . amoxicillin (AMOXIL) 875 MG tablet Take 875 mg by mouth 2 (two) times daily.   Marland Kitchen ibuprofen (ADVIL) 200 MG tablet Take 400 mg by mouth every 6 (six) hours as needed for fever or moderate pain.  Marland Kitchen ibuprofen (ADVIL) 800 MG tablet Take 800 mg by mouth every 8 (eight) hours as needed.  . ondansetron (ZOFRAN ODT) 8 MG disintegrating tablet Take 1 tablet (8 mg total) by mouth every 8 (eight) hours as needed for nausea or vomiting.  . promethazine (PHENERGAN) 12.5 MG tablet Take 1 tablet (12.5 mg total) by mouth every 8 (eight) hours as needed for nausea or vomiting.  . [DISCONTINUED] HYDROcodone-acetaminophen (NORCO/VICODIN) 5-325 MG tablet Take 1 tablet by mouth every 6 (six) hours as needed.   No facility-administered encounter medications on file as of 12/29/2019.    No Known Allergies  Review of Systems  Constitutional: Positive for activity change, appetite change and fatigue. Negative for chills, diaphoresis, fever, unexpected weight change and  weight loss.  HENT: Negative.   Eyes: Negative.  Negative for visual disturbance.  Respiratory: Negative for cough, chest tightness and shortness of breath.   Cardiovascular: Negative for chest pain, palpitations and leg swelling.  Gastrointestinal: Positive for abdominal pain, nausea and vomiting. Negative for abdominal distention, anal bleeding, blood in stool, constipation, diarrhea and rectal pain.  Endocrine: Negative.   Genitourinary: Positive for decreased urine volume. Negative for difficulty urinating, dysuria, flank pain, frequency and urgency.  Musculoskeletal: Positive for back pain and myalgias. Negative for arthralgias.  Skin: Negative.   Allergic/Immunologic: Negative.   Neurological: Positive for weakness (generalized). Negative for dizziness, tremors, seizures, syncope, facial asymmetry, speech difficulty, light-headedness, numbness and headaches.  Hematological: Negative.   Psychiatric/Behavioral: Negative for confusion, hallucinations, sleep disturbance and suicidal ideas.  All other systems reviewed and are negative.  Observations/Objective: No vital signs or physical exam, this was a telephone or virtual health encounter.  Pt alert and oriented, answers all questions appropriately, and able to speak in full sentences.    Assessment and Plan: Nishawn was seen today for vomiting.  Diagnoses and all orders for this visit:  AKI (acute kidney injury) ([place]) Pt aware if he continues to vomit and is unable to hold fluids down, he needs to return to ED for IV hydration. Pt to report to office to have labs repeated to determine if renal function is improving. Adequate hydration discussed in detail.  -     BMP8+EGFR; Future  Nausea and vomiting in adult Ongoing nausea and vomiting despite zofran. Will change to low dose phenergan every 8 hours as needed for nausea and vomiting. Criss Rosales, BRAT diet discussed in detail. Adequate hydration discussed in detail. Stop  hydrocodone and ibuprofen, as this can increase abdominal irritation. Can take plain tylenol as needed for aches and pains. Pt aware of symptoms that require return visit to ED. Medications as prescribed. Sedation precautions given.  -     promethazine (PHENERGAN) 12.5 MG tablet; Take 1 tablet (12.5 mg total) by mouth every 8 (eight) hours as needed for nausea or vomiting.     Follow Up Instructions: Return in about 1 week (around 01/05/2020), or if symptoms worsen or fail to improve.    I discussed the assessment and treatment plan with the patient. The patient was provided an opportunity to ask questions and all were answered. The patient agreed with the plan and demonstrated an understanding of the instructions.   The patient was advised to call back or seek an in-person evaluation if the symptoms worsen or if the condition fails to improve as anticipated.  The above assessment and management plan was discussed with the patient. The patient verbalized understanding of and has agreed to the management plan. Patient is aware to call the clinic if they develop any new symptoms or if symptoms persist or worsen. Patient is aware when to return to the clinic for a follow-up visit. Patient educated on when it is appropriate to go to the emergency department.    I provided 25 minutes of non-face-to-face time during this encounter. The call started at 1345. The call ended at 1410. The other time was used for coordination of care.    Monia Pouch, FNP-C [facility] [address] [place], [place] [address] [phone] 12/29/2019

## 2019-12-30 DIAGNOSIS — R1084 Generalized abdominal pain: Secondary | ICD-10-CM | POA: Diagnosis not present

## 2019-12-30 DIAGNOSIS — Z20822 Contact with and (suspected) exposure to covid-19: Secondary | ICD-10-CM | POA: Diagnosis not present

## 2019-12-30 DIAGNOSIS — Z5320 Procedure and treatment not carried out because of patient's decision for unspecified reasons: Secondary | ICD-10-CM | POA: Diagnosis not present

## 2019-12-30 DIAGNOSIS — M545 Low back pain: Secondary | ICD-10-CM | POA: Diagnosis not present

## 2019-12-30 DIAGNOSIS — N179 Acute kidney failure, unspecified: Secondary | ICD-10-CM | POA: Diagnosis not present

## 2019-12-30 DIAGNOSIS — R5381 Other malaise: Secondary | ICD-10-CM | POA: Diagnosis not present

## 2019-12-30 DIAGNOSIS — E876 Hypokalemia: Secondary | ICD-10-CM | POA: Diagnosis not present

## 2019-12-30 DIAGNOSIS — R52 Pain, unspecified: Secondary | ICD-10-CM | POA: Diagnosis not present

## 2019-12-30 DIAGNOSIS — R1111 Vomiting without nausea: Secondary | ICD-10-CM | POA: Diagnosis not present

## 2019-12-30 DIAGNOSIS — R112 Nausea with vomiting, unspecified: Secondary | ICD-10-CM | POA: Diagnosis not present

## 2019-12-30 DIAGNOSIS — R7989 Other specified abnormal findings of blood chemistry: Secondary | ICD-10-CM | POA: Diagnosis not present

## 2020-01-01 ENCOUNTER — Other Ambulatory Visit: Payer: Self-pay

## 2020-01-01 ENCOUNTER — Encounter: Payer: Self-pay | Admitting: Family

## 2020-01-01 ENCOUNTER — Ambulatory Visit (INDEPENDENT_AMBULATORY_CARE_PROVIDER_SITE_OTHER): Payer: Medicaid Other | Admitting: Family

## 2020-01-01 VITALS — BP 148/96 | HR 74 | Temp 98.0°F | Ht 65.0 in | Wt 146.4 lb

## 2020-01-01 DIAGNOSIS — R112 Nausea with vomiting, unspecified: Secondary | ICD-10-CM

## 2020-01-01 DIAGNOSIS — F419 Anxiety disorder, unspecified: Secondary | ICD-10-CM | POA: Diagnosis not present

## 2020-01-01 DIAGNOSIS — N179 Acute kidney failure, unspecified: Secondary | ICD-10-CM

## 2020-01-01 DIAGNOSIS — Z09 Encounter for follow-up examination after completed treatment for conditions other than malignant neoplasm: Secondary | ICD-10-CM

## 2020-01-01 NOTE — Progress Notes (Signed)
Subjective:    Patient ID: Willie Foley [facility], male    DOB: Mar 13, 1996, 24 y.o.   MRN: [id number]  Chief Complaint  Patient presents with  . Hospitalization Follow-up    Back pain, x 2 mths ago. N&V, not eating, Kidney injury. Patient went to [place] [place] and [facility]  . Constipation  . Hyperlipidemia    HPI  Pt presents to the office today for hospital follow up. He reports he went to [facility] on 12/27/19 with back pain, nausea and vomiting. He had a CT scan that did not show any acute problems.   He then went to [facility] 12/28/19 and had normal CK, Lipase, COVID negative. His creatinine was 2.34.   He went to [facility] on 12/30/19 and his creatinine was 2.99, his potassium was 3.1. He was still having back pain and nausea and vomiting. They wanted to keep him, but his mother was not allowed to stay and he left.   He reports his has not vomited since 12/30/19. He reports his is having intermittent middle back of 8 out 10. He has taken tylenol. He has been forcing fluids and able to keep food down over the last 2 days.      Review of Systems  Musculoskeletal: Positive for back pain.  All other systems reviewed and are negative.      Objective:   Physical Exam Vitals reviewed.  Constitutional:      General: He is not in acute distress.    Appearance: He is well-developed.  HENT:     Head: Normocephalic. Head contusion: dark circle under bilateral eyes.     Right Ear: Tympanic membrane normal.     Left Ear: Tympanic membrane normal.  Eyes:     General:        Right eye: No discharge.        Left eye: No discharge.     Pupils: Pupils are equal, round, and reactive to light.  Neck:     Thyroid: No thyromegaly.  Cardiovascular:     Rate and Rhythm: Normal rate and regular rhythm.     Heart sounds: Normal heart sounds. No murmur.  Pulmonary:     Effort: Pulmonary effort is normal. No respiratory distress.     Breath sounds: Normal breath sounds. No  wheezing.  Abdominal:     General: Bowel sounds are normal. There is no distension.     Palpations: Abdomen is soft.     Tenderness: There is no abdominal tenderness.  Musculoskeletal:        General: No tenderness. Normal range of motion.     Cervical back: Normal range of motion and neck supple.  Skin:    General: Skin is warm and dry.     Findings: No erythema or rash.  Neurological:     Mental Status: He is alert and oriented to person, place, and time.     Cranial Nerves: No cranial nerve deficit.     Deep Tendon Reflexes: Reflexes are normal and symmetric.  Psychiatric:        Mood and Affect: Mood is anxious. Affect is tearful.        Behavior: Behavior normal.        Thought Content: Thought content normal.        Judgment: Judgment normal.     Comments: PT crying and upset today       Blood pressure (!) 148/96, pulse 74, temperature 98 F (36.7 C), temperature  source Temporal, height _0  (1.651 m), weight 146 lb 6.4 oz (66.4 kg), SpO2 100 %.     Assessment & Plan:  1. AKI (acute kidney injury) ([place]) - CMP14+EGFR - CBC with Differential/Platelet  2. Nausea and vomiting, intractability of vomiting not specified, unspecified vomiting type - CMP14+EGFR - CBC with Differential/Platelet  3. Hospital discharge follow-up - CMP14+EGFR - CBC with Differential/Platelet  4. Anxiety   Long discussion with patient that if Creatinine is still elevated he will need to go to ED. It is possible it could have been from dehydration given N& V, worrisome though it was that elevated.  I have ordered these labs stat and if decreasing will recheck tomorrow. He has not thrown up since 12/30/19 and has been forcing fluids. He continues to have intermittent back pain.  PT is very upset and anxious today, stating he does not want to go to ED if his mother can not go. I discussed with him the importance of going if lab work is abnormal. He agrees.   Willie Dun, FNP

## 2020-01-01 NOTE — Patient Instructions (Signed)

## 2020-01-02 ENCOUNTER — Encounter: Payer: Self-pay | Admitting: Family Medicine

## 2020-01-02 ENCOUNTER — Other Ambulatory Visit: Payer: Self-pay | Admitting: Family

## 2020-01-02 ENCOUNTER — Other Ambulatory Visit: Payer: Medicaid Other

## 2020-01-02 ENCOUNTER — Telehealth: Payer: Self-pay | Admitting: Family Medicine

## 2020-01-02 ENCOUNTER — Other Ambulatory Visit: Payer: Self-pay

## 2020-01-02 DIAGNOSIS — R7989 Other specified abnormal findings of blood chemistry: Secondary | ICD-10-CM

## 2020-01-02 DIAGNOSIS — N179 Acute kidney failure, unspecified: Secondary | ICD-10-CM | POA: Diagnosis not present

## 2020-01-02 LAB — CMP14+EGFR
ALT: 8 IU/L (ref 0–44)
AST: 15 IU/L (ref 0–40)
Albumin/Globulin Ratio: 1.7 (ref 1.2–2.2)
Albumin: 3.9 g/dL — ABNORMAL LOW (ref 4.1–5.2)
Alkaline Phosphatase: 89 IU/L (ref 39–117)
BUN/Creatinine Ratio: 6 — ABNORMAL LOW (ref 9–20)
BUN: 15 mg/dL (ref 6–20)
Bilirubin Total: 0.7 mg/dL (ref 0.0–1.2)
CO2: 26 mmol/L (ref 20–29)
Calcium: 8.9 mg/dL (ref 8.7–10.2)
Chloride: 105 mmol/L (ref 96–106)
Creatinine, Ser: 2.58 mg/dL — ABNORMAL HIGH (ref 0.76–1.27)
GFR calc Af Amer: 39 mL/min/{1.73_m2} — ABNORMAL LOW (ref >59)
GFR calc non Af Amer: 34 mL/min/{1.73_m2} — ABNORMAL LOW (ref >59)
Globulin, Total: 2.3 g/dL (ref 1.5–4.5)
Glucose: 89 mg/dL (ref 65–99)
Potassium: 3.7 mmol/L (ref 3.5–5.2)
Sodium: 146 mmol/L — ABNORMAL HIGH (ref 134–144)
Total Protein: 6.2 g/dL (ref 6.0–8.5)

## 2020-01-02 LAB — CBC WITH DIFFERENTIAL/PLATELET
Basophils Absolute: 0 10*3/uL (ref 0.0–0.2)
Basos: 0 %
EOS (ABSOLUTE): 0 10*3/uL (ref 0.0–0.4)
Eos: 1 %
Hematocrit: 36.3 % — ABNORMAL LOW (ref 37.5–51.0)
Hemoglobin: 12.5 g/dL — ABNORMAL LOW (ref 13.0–17.7)
Immature Grans (Abs): 0 10*3/uL (ref 0.0–0.1)
Immature Granulocytes: 0 %
Lymphocytes Absolute: 1.1 10*3/uL (ref 0.7–3.1)
Lymphs: 20 %
MCH: 29.1 pg (ref 26.6–33.0)
MCHC: 34.4 g/dL (ref 31.5–35.7)
MCV: 84 fL (ref 79–97)
Monocytes Absolute: 0.6 10*3/uL (ref 0.1–0.9)
Monocytes: 11 %
Neutrophils Absolute: 3.7 10*3/uL (ref 1.4–7.0)
Neutrophils: 68 %
Platelets: 269 10*3/uL (ref 150–450)
RBC: 4.3 x10E6/uL (ref 4.14–5.80)
RDW: 12.2 % (ref 11.6–15.4)
WBC: 5.4 10*3/uL (ref 3.4–10.8)

## 2020-01-02 LAB — BMP8+EGFR
BUN/Creatinine Ratio: 5 — ABNORMAL LOW (ref 9–20)
BUN: 12 mg/dL (ref 6–20)
CO2: 27 mmol/L (ref 20–29)
Calcium: 9 mg/dL (ref 8.7–10.2)
Chloride: 100 mmol/L (ref 96–106)
Creatinine, Ser: 2.21 mg/dL — ABNORMAL HIGH (ref 0.76–1.27)
GFR calc Af Amer: 47 mL/min/{1.73_m2} — ABNORMAL LOW (ref >59)
GFR calc non Af Amer: 41 mL/min/{1.73_m2} — ABNORMAL LOW (ref >59)
Glucose: 99 mg/dL (ref 65–99)
Potassium: 3 mmol/L — ABNORMAL LOW (ref 3.5–5.2)
Sodium: 142 mmol/L (ref 134–144)

## 2020-01-02 NOTE — Telephone Encounter (Signed)
Patient calling to confirm he needs to come back in today for BMP.  There is an order placed today for this.  Just need to verify when you want him to come in.

## 2020-01-03 ENCOUNTER — Telehealth: Payer: Self-pay | Admitting: Family Medicine

## 2020-01-03 NOTE — Telephone Encounter (Signed)
Patient is aware and going to ED

## 2020-01-03 NOTE — Telephone Encounter (Signed)
His creatinine came down a little, but nowhere near as much as it should have, so yes he needs to go to the E.D. [facility]

## 2020-01-03 NOTE — Telephone Encounter (Signed)
Patient called stating his creaitine numbers are going down. He wants to make sure he does not have to go to hospital for iv fluids. Patient is complaining of stomach pain scale of 1-10 a 8. Last time patient went to bathroom was last night with little hard balls nothing else. Patient has not taking anything. What do you suggest?

## 2020-01-08 ENCOUNTER — Other Ambulatory Visit: Payer: Self-pay | Admitting: Family

## 2020-01-08 ENCOUNTER — Other Ambulatory Visit: Payer: Medicaid Other

## 2020-01-08 ENCOUNTER — Other Ambulatory Visit: Payer: Self-pay

## 2020-01-08 DIAGNOSIS — R7989 Other specified abnormal findings of blood chemistry: Secondary | ICD-10-CM

## 2020-01-08 LAB — BMP8+EGFR
BUN/Creatinine Ratio: 13 (ref 9–20)
BUN: 17 mg/dL (ref 6–20)
CO2: 25 mmol/L (ref 20–29)
Calcium: 9.9 mg/dL (ref 8.7–10.2)
Chloride: 101 mmol/L (ref 96–106)
Creatinine, Ser: 1.28 mg/dL — ABNORMAL HIGH (ref 0.76–1.27)
GFR calc Af Amer: 92 mL/min/{1.73_m2} (ref >59)
GFR calc non Af Amer: 79 mL/min/{1.73_m2} (ref >59)
Glucose: 89 mg/dL (ref 65–99)
Potassium: 4.5 mmol/L (ref 3.5–5.2)
Sodium: 139 mmol/L (ref 134–144)

## 2020-01-08 NOTE — Progress Notes (Signed)
Can we verify patient went to the ED? I do not see repeat lab work or hospital notes.

## 2020-01-08 NOTE — Progress Notes (Signed)
LMTCB

## 2020-01-08 NOTE — Addendum Note (Signed)
Addended by: Angela Adam on: 01/08/2020 09:45 AM   Modules accepted: Orders

## 2020-01-08 NOTE — Progress Notes (Signed)
Patient states he did not go to ER since all were booked.  States he feels fine.  Advised patient that he needs to go to ER and patient refuses.  Per [place] patient needs to come in ASAP to get STAT lab work.  Order placed and pateint aware and verbalizes understanding.

## 2020-03-29 DIAGNOSIS — K0889 Other specified disorders of teeth and supporting structures: Secondary | ICD-10-CM | POA: Diagnosis not present

## 2020-06-28 DIAGNOSIS — J40 Bronchitis, not specified as acute or chronic: Secondary | ICD-10-CM | POA: Diagnosis not present

## 2020-06-28 DIAGNOSIS — J029 Acute pharyngitis, unspecified: Secondary | ICD-10-CM | POA: Diagnosis not present

## 2020-06-28 DIAGNOSIS — R05 Cough: Secondary | ICD-10-CM | POA: Diagnosis not present

## 2020-06-28 DIAGNOSIS — R52 Pain, unspecified: Secondary | ICD-10-CM | POA: Diagnosis not present

## 2021-08-16 DIAGNOSIS — Z23 Encounter for immunization: Secondary | ICD-10-CM | POA: Diagnosis not present

## 2021-08-16 DIAGNOSIS — S91332A Puncture wound without foreign body, left foot, initial encounter: Secondary | ICD-10-CM | POA: Diagnosis not present

## 2021-10-11 DIAGNOSIS — J40 Bronchitis, not specified as acute or chronic: Secondary | ICD-10-CM | POA: Diagnosis not present

## 2021-10-11 DIAGNOSIS — R6883 Chills (without fever): Secondary | ICD-10-CM | POA: Diagnosis not present

## 2021-10-11 DIAGNOSIS — R059 Cough, unspecified: Secondary | ICD-10-CM | POA: Diagnosis not present

## 2021-10-11 DIAGNOSIS — R0602 Shortness of breath: Secondary | ICD-10-CM | POA: Diagnosis not present

## 2022-09-01 DIAGNOSIS — U071 COVID-19: Secondary | ICD-10-CM | POA: Diagnosis not present

## 2022-09-09 DIAGNOSIS — R000 Tachycardia, unspecified: Secondary | ICD-10-CM | POA: Diagnosis not present

## 2022-09-09 DIAGNOSIS — R072 Precordial pain: Secondary | ICD-10-CM | POA: Diagnosis not present

## 2022-09-09 DIAGNOSIS — U071 COVID-19: Secondary | ICD-10-CM | POA: Diagnosis not present

## 2022-09-11 ENCOUNTER — Ambulatory Visit: Payer: Medicaid Other | Admitting: Family Medicine

## 2022-09-11 ENCOUNTER — Encounter: Payer: Self-pay | Admitting: Family Medicine

## 2022-09-11 VITALS — BP 126/86 | HR 110 | Temp 98.1°F | Ht 65.0 in | Wt 133.4 lb

## 2022-09-11 DIAGNOSIS — R051 Acute cough: Secondary | ICD-10-CM

## 2022-09-11 DIAGNOSIS — R519 Headache, unspecified: Secondary | ICD-10-CM

## 2022-09-11 DIAGNOSIS — B349 Viral infection, unspecified: Secondary | ICD-10-CM | POA: Diagnosis not present

## 2022-09-11 MED ORDER — KETOROLAC TROMETHAMINE 30 MG/ML IJ SOLN
30.0000 mg | Freq: Once | INTRAMUSCULAR | Status: AC
  Administered 2022-09-11: 30 mg via INTRAMUSCULAR

## 2022-09-11 NOTE — Progress Notes (Signed)
Subjective:  Patient ID: Willie Foley [facility], male    DOB: 1996-02-05, 26 y.o.   MRN: [id number]  Patient Care Team: Claretta Fraise, MD as PCP - General (Family Medicine)   Chief Complaint:  ER follow up (09/09/22 [facility] - chest pain. Patient states he is still having a lot of SOB. )   HPI: Willie Foley is a 26 y.o. male presenting on 09/11/2022 for ER follow up (09/09/22 [facility] - chest pain. Patient states he is still having a lot of SOB. )   Pt presents today for follow up after recent ED visit for ongoing COVID symptoms. He was diagnosed with COVID 09/01/2022. He was started on antivirals. On 09/09/2022 he had continued cough, shortness of breath, and chest pain. He went to [facility] ED for evaluation. Workup was negative for PNA, PE, or acute cardiac event. He was diagnosed with chest pain associated with COVID. He reports the chest pain has resolved since ED visit. Cough and shortness of breath are improving daily. States his biggest concern today is his headache. He has been taking tylenol with minimal relief of symptoms. States aching to throbbing in nature. No associates nausea, vomiting, dizziness, visual changes, photophobia, or phonophobia.      Relevant past medical, surgical, family, and social history reviewed and updated as indicated.  Allergies and medications reviewed and updated. Data reviewed: Chart in Epic.   Past Medical History:  Diagnosis Date   ADHD (attention deficit hyperactivity disorder)    Chronic chest pain    Long Q-T syndrome    workup by [facility] Dr. Raul Del NEGATIVE for this   Near syncope    recurrent   Vasovagal near syncope    worked up at [place]   Weakness of both legs    recurrent, intermittent    History reviewed. No pertinent surgical history.  Social History   Socioeconomic History   Marital status: Single    Spouse name: Not on file   Number of children: Not on file   Years of education: Not on file   Highest education  level: Not on file  Occupational History   Not on file  Tobacco Use   Smoking status: Never   Smokeless tobacco: Never  Vaping Use   Vaping Use: Never used  Substance and Sexual Activity   Alcohol use: No   Drug use: Yes    Frequency: 3.0 times per week    Types: Marijuana   Sexual activity: Not on file  Other Topics Concern   Not on file  Social History Narrative   Not on file   Social Determinants of Health   Financial Resource Strain: Not on file  Food Insecurity: Not on file  Transportation Needs: Not on file  Physical Activity: Not on file  Stress: Not on file  Social Connections: Not on file  Intimate Partner Violence: Not on file    Outpatient Encounter Medications as of 09/11/2022  Medication Sig   Acetaminophen (TYLENOL) 325 MG CAPS Take by mouth.   albuterol (VENTOLIN HFA) 108 (90 Base) MCG/ACT inhaler Inhale into the lungs.   azithromycin (ZITHROMAX) 250 MG tablet Take 250 mg by mouth as directed.   methylPREDNISolone (MEDROL DOSEPAK) 4 MG TBPK tablet Take by mouth.   nirmatrelvir & ritonavir (PAXLOVID) 20 x 150 MG & 10 x 100MG TBPK See package instructions.   [DISCONTINUED] ondansetron (ZOFRAN ODT) 8 MG disintegrating tablet Take 1 tablet (8 mg total) by mouth every 8 (  eight) hours as needed for nausea or vomiting.   [EXPIRED] ketorolac (TORADOL) 30 MG/ML injection 30 mg    No facility-administered encounter medications on file as of 09/11/2022.    No Active Allergies  Review of Systems  Constitutional:  Positive for activity change, appetite change, chills and fatigue. Negative for diaphoresis, fever and unexpected weight change.  HENT:  Positive for congestion, postnasal drip and rhinorrhea.   Respiratory:  Positive for cough and shortness of breath. Negative for apnea, choking, chest tightness, wheezing and stridor.   Cardiovascular:  Negative for chest pain, palpitations and leg swelling.  Gastrointestinal:  Negative for abdominal pain, diarrhea, nausea  and vomiting.  Genitourinary:  Negative for decreased urine volume and difficulty urinating.  Neurological:  Positive for headaches. Negative for dizziness, tremors, seizures, syncope, facial asymmetry, speech difficulty, weakness, light-headedness and numbness.  Psychiatric/Behavioral:  Negative for confusion.   All other systems reviewed and are negative.       Objective:  BP 126/86   Pulse (!) 110   Temp 98.1 F (36.7 C) (Temporal)   Ht _0  (1.651 m)   Wt 133 lb 6.4 oz (60.5 kg)   SpO2 98%   BMI 22.20 kg/m    Wt Readings from Last 3 Encounters:  09/11/22 133 lb 6.4 oz (60.5 kg)  01/01/20 146 lb 6.4 oz (66.4 kg)  12/28/19 140 lb (63.5 kg)    Physical Exam Vitals and nursing note reviewed.  Constitutional:      General: He is not in acute distress.    Appearance: Normal appearance. He is well-developed, well-groomed and normal weight. He is not ill-appearing, toxic-appearing or diaphoretic.  HENT:     Head: Normocephalic and atraumatic.     Jaw: There is normal jaw occlusion.     Right Ear: Hearing, tympanic membrane, ear canal and external ear normal.     Left Ear: Hearing, tympanic membrane, ear canal and external ear normal.     Nose: Nose normal.     Mouth/Throat:     Lips: Pink.     Mouth: Mucous membranes are moist.     Pharynx: Oropharynx is clear. Uvula midline.  Eyes:     General: Lids are normal.     Extraocular Movements: Extraocular movements intact.     Conjunctiva/sclera: Conjunctivae normal.     Pupils: Pupils are equal, round, and reactive to light.  Neck:     Thyroid: No thyroid mass, thyromegaly or thyroid tenderness.     Vascular: No carotid bruit or JVD.     Trachea: Trachea and phonation normal.  Cardiovascular:     Rate and Rhythm: Normal rate and regular rhythm.     Chest Wall: PMI is not displaced.     Pulses: Normal pulses.     Heart sounds: Normal heart sounds. No murmur heard.    No friction rub. No gallop.  Pulmonary:     Effort:  Pulmonary effort is normal. No respiratory distress.     Breath sounds: Normal breath sounds. No stridor. No wheezing, rhonchi or rales.  Chest:     Chest wall: No tenderness.  Abdominal:     General: Bowel sounds are normal. There is no distension or abdominal bruit.     Palpations: Abdomen is soft. There is no hepatomegaly or splenomegaly.     Tenderness: There is no abdominal tenderness. There is no right CVA tenderness or left CVA tenderness.     Hernia: No hernia is present.  Musculoskeletal:  General: Normal range of motion.     Cervical back: Normal range of motion and neck supple.     Right lower leg: No edema.     Left lower leg: No edema.  Lymphadenopathy:     Cervical: No cervical adenopathy.  Skin:    General: Skin is warm and dry.     Capillary Refill: Capillary refill takes less than 2 seconds.     Coloration: Skin is not cyanotic, jaundiced or pale.     Findings: No rash.  Neurological:     General: No focal deficit present.     Mental Status: He is alert and oriented to person, place, and time.     Sensory: Sensation is intact.     Motor: Motor function is intact.     Coordination: Coordination is intact.     Gait: Gait is intact.     Deep Tendon Reflexes: Reflexes are normal and symmetric.  Psychiatric:        Attention and Perception: Attention and perception normal.        Mood and Affect: Mood and affect normal.        Speech: Speech normal.        Behavior: Behavior normal. Behavior is cooperative.        Thought Content: Thought content normal.        Cognition and Memory: Cognition and memory normal.        Judgment: Judgment normal.     Results for orders placed or performed in visit on 01/08/20  BMP8+EGFR  Result Value Ref Range   Glucose 89 65 - 99 mg/dL   BUN 17 6 - 20 mg/dL   Creatinine, Ser 1.28 (H) 0.76 - 1.27 mg/dL   GFR calc non Af Amer 79 >59 mL/min/1.73   GFR calc Af Amer 92 >59 mL/min/1.73   BUN/Creatinine Ratio 13 9 - 20    Sodium 139 134 - 144 mmol/L   Potassium 4.5 3.5 - 5.2 mmol/L   Chloride 101 96 - 106 mmol/L   CO2 25 20 - 29 mmol/L   Calcium 9.9 8.7 - 10.2 mg/dL       Pertinent labs & imaging results that were available during my care of the patient were reviewed by me and considered in my medical decision making.  Assessment & Plan:  Frandy was seen today for er follow up.  Diagnoses and all orders for this visit:  Acute cough COVID associated. Improving daily. Pt aware to report new, worsening, or persistent symptoms.   Headache due to viral infection No red flags. Toradol IM in office. Symptomatic care at home discussed in detail. Pt aware to report new, worsening, or persistent symptoms.  -     ketorolac (TORADOL) 30 MG/ML injection 30 mg     Continue all other maintenance medications.  Follow up plan: Return if symptoms worsen or fail to improve.   Continue healthy lifestyle choices, including diet (rich in fruits, vegetables, and lean proteins, and low in salt and simple carbohydrates) and exercise (at least 30 minutes of moderate physical activity daily).  Educational handout given for COVID  The above assessment and management plan was discussed with the patient. The patient verbalized understanding of and has agreed to the management plan. Patient is aware to call the clinic if they develop any new symptoms or if symptoms persist or worsen. Patient is aware when to return to the clinic for a follow-up visit. Patient educated on when it is appropriate to  go to the emergency department.   Monia Pouch, FNP-C [facility] [phone]

## 2022-09-11 NOTE — Patient Instructions (Signed)
Excedrin Tension Headache

## 2022-10-16 ENCOUNTER — Telehealth: Payer: Self-pay | Admitting: Family Medicine

## 2022-10-16 NOTE — Telephone Encounter (Signed)
Ok for note 

## 2022-10-19 ENCOUNTER — Encounter: Payer: Self-pay | Admitting: *Deleted

## 2022-10-19 NOTE — Telephone Encounter (Signed)
yes

## 2022-10-19 NOTE — Telephone Encounter (Signed)
Note up front for pick up  Called patient and mother, no answer

## 2022-10-21 NOTE — Telephone Encounter (Signed)
Called number provided in chart, was advised wrong number

## 2023-03-02 DIAGNOSIS — K0889 Other specified disorders of teeth and supporting structures: Secondary | ICD-10-CM | POA: Diagnosis not present

## 2023-03-05 DIAGNOSIS — K0889 Other specified disorders of teeth and supporting structures: Secondary | ICD-10-CM | POA: Diagnosis not present

## 2023-03-05 DIAGNOSIS — R220 Localized swelling, mass and lump, head: Secondary | ICD-10-CM | POA: Diagnosis not present

## 2024-01-18 DIAGNOSIS — B349 Viral infection, unspecified: Secondary | ICD-10-CM | POA: Diagnosis not present

## 2024-01-18 DIAGNOSIS — R070 Pain in throat: Secondary | ICD-10-CM | POA: Diagnosis not present

## 2024-01-18 DIAGNOSIS — R051 Acute cough: Secondary | ICD-10-CM | POA: Diagnosis not present

## 2024-01-18 DIAGNOSIS — Z20822 Contact with and (suspected) exposure to covid-19: Secondary | ICD-10-CM | POA: Diagnosis not present
